# Patient Record
Sex: Male | Born: 1990 | Race: Black or African American | Hispanic: No | Marital: Single | Smoking: Current every day smoker
Health system: Southern US, Community
[De-identification: ages and names within clinical notes are randomized; demographics above are authoritative.]

## PROBLEM LIST (undated history)

## (undated) DIAGNOSIS — R55 Syncope and collapse: Secondary | ICD-10-CM

## (undated) DIAGNOSIS — K2970 Gastritis, unspecified, without bleeding: Secondary | ICD-10-CM

## (undated) DIAGNOSIS — R079 Chest pain, unspecified: Secondary | ICD-10-CM

## (undated) HISTORY — DX: Syncope and collapse: R55

## (undated) HISTORY — DX: Gastritis, unspecified, without bleeding: K29.70

## (undated) HISTORY — DX: Chest pain, unspecified: R07.9

---

## 2008-02-29 ENCOUNTER — Emergency Department (HOSPITAL_COMMUNITY): Admission: EM | Admit: 2008-02-29 | Discharge: 2008-02-29 | Payer: Self-pay | Admitting: Emergency Medicine

## 2009-02-10 ENCOUNTER — Emergency Department (HOSPITAL_COMMUNITY): Admission: EM | Admit: 2009-02-10 | Discharge: 2009-02-10 | Payer: Self-pay | Admitting: Emergency Medicine

## 2010-09-06 LAB — STREP A DNA PROBE: Group A Strep Probe: NEGATIVE

## 2010-10-31 ENCOUNTER — Inpatient Hospital Stay (INDEPENDENT_AMBULATORY_CARE_PROVIDER_SITE_OTHER)
Admission: RE | Admit: 2010-10-31 | Discharge: 2010-10-31 | Disposition: A | Discharge status: Home / Self Care | Payer: Self-pay | Source: Ambulatory Visit | Attending: Family Medicine | Admitting: Family Medicine

## 2010-10-31 DIAGNOSIS — K089 Disorder of teeth and supporting structures, unspecified: Secondary | ICD-10-CM

## 2011-03-03 LAB — POCT I-STAT, CHEM 8
Calcium, Ion: 1.16
Hemoglobin: 15.6
Sodium: 136
TCO2: 27

## 2011-12-05 ENCOUNTER — Emergency Department (HOSPITAL_COMMUNITY)
Admission: EM | Admit: 2011-12-05 | Discharge: 2011-12-05 | Disposition: A | Discharge status: Home / Self Care | Payer: No Typology Code available for payment source | Attending: Emergency Medicine | Admitting: Emergency Medicine

## 2011-12-05 ENCOUNTER — Encounter (HOSPITAL_COMMUNITY): Payer: Self-pay | Admitting: Physical Medicine and Rehabilitation

## 2011-12-05 DIAGNOSIS — F17200 Nicotine dependence, unspecified, uncomplicated: Secondary | ICD-10-CM | POA: Insufficient documentation

## 2011-12-05 DIAGNOSIS — S20219A Contusion of unspecified front wall of thorax, initial encounter: Secondary | ICD-10-CM

## 2011-12-05 DIAGNOSIS — M542 Cervicalgia: Secondary | ICD-10-CM | POA: Insufficient documentation

## 2011-12-05 DIAGNOSIS — R0789 Other chest pain: Secondary | ICD-10-CM | POA: Insufficient documentation

## 2011-12-05 DIAGNOSIS — S161XXA Strain of muscle, fascia and tendon at neck level, initial encounter: Secondary | ICD-10-CM

## 2011-12-05 DIAGNOSIS — M549 Dorsalgia, unspecified: Secondary | ICD-10-CM | POA: Insufficient documentation

## 2011-12-05 MED ORDER — OXYCODONE-ACETAMINOPHEN 5-325 MG PO TABS: 1.0000 | ORAL_TABLET | Freq: Four times a day (QID) | ORAL | Status: AC | PRN

## 2011-12-05 MED ORDER — IBUPROFEN 800 MG PO TABS
ORAL_TABLET | ORAL | Status: AC
  Filled 2011-12-05: qty 1

## 2011-12-05 MED ORDER — OXYCODONE-ACETAMINOPHEN 5-325 MG PO TABS
1.0000 | ORAL_TABLET | Freq: Once | ORAL | Status: AC
  Administered 2011-12-05: 1 via ORAL
  Filled 2011-12-05: qty 1

## 2011-12-05 MED ORDER — IBUPROFEN 400 MG PO TABS
800.0000 mg | ORAL_TABLET | Freq: Once | ORAL | Status: AC
  Administered 2011-12-05: 800 mg via ORAL

## 2011-12-05 MED ORDER — IBUPROFEN 800 MG PO TABS: 800.0000 mg | ORAL_TABLET | Freq: Three times a day (TID) | ORAL | Status: AC | PRN
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
## 2012-07-26 ENCOUNTER — Encounter (HOSPITAL_COMMUNITY): Payer: Self-pay | Admitting: *Deleted

## 2012-07-26 ENCOUNTER — Emergency Department (HOSPITAL_COMMUNITY)
Admission: EM | Admit: 2012-07-26 | Discharge: 2012-07-26 | Disposition: A | Discharge status: Home / Self Care | Payer: Self-pay | Attending: Emergency Medicine | Admitting: Emergency Medicine

## 2012-07-26 DIAGNOSIS — R079 Chest pain, unspecified: Secondary | ICD-10-CM

## 2012-07-26 DIAGNOSIS — F17200 Nicotine dependence, unspecified, uncomplicated: Secondary | ICD-10-CM | POA: Insufficient documentation

## 2012-07-26 DIAGNOSIS — R0789 Other chest pain: Secondary | ICD-10-CM | POA: Insufficient documentation

## 2012-07-26 LAB — CBC WITH DIFFERENTIAL/PLATELET
Basophils Absolute: 0 10*3/uL (ref 0.0–0.1)
Eosinophils Absolute: 0 10*3/uL (ref 0.0–0.7)
Eosinophils Relative: 0 % (ref 0–5)
Lymphocytes Relative: 21 % (ref 12–46)
Lymphs Abs: 1.8 10*3/uL (ref 0.7–4.0)
MCV: 82 fL (ref 78.0–100.0)
Neutrophils Relative %: 72 % (ref 43–77)
Platelets: 196 10*3/uL (ref 150–400)
RBC: 4.84 MIL/uL (ref 4.22–5.81)
RDW: 12.9 % (ref 11.5–15.5)
WBC: 8.8 10*3/uL (ref 4.0–10.5)

## 2012-07-26 LAB — COMPREHENSIVE METABOLIC PANEL
ALT: 15 U/L (ref 0–53)
AST: 20 U/L (ref 0–37)
Alkaline Phosphatase: 63 U/L (ref 39–117)
CO2: 28 mEq/L (ref 19–32)
Calcium: 9.6 mg/dL (ref 8.4–10.5)
Glucose, Bld: 93 mg/dL (ref 70–99)
Potassium: 3.7 mEq/L (ref 3.5–5.1)
Sodium: 139 mEq/L (ref 135–145)
Total Protein: 7.1 g/dL (ref 6.0–8.3)

## 2012-07-26 NOTE — ED Notes (Signed)
The pt has had lt upper chest pain for 2-3 years.  His pain has been worse for the past 2-3 months.  No distress.  The pain is worse with movement

## 2012-07-26 NOTE — ED Provider Notes (Signed)
History    This chart was scribed for Lyanne Co, MD by Gerlean Ren, ED Scribe. This patient was seen in room TR09C/TR09C and the patient's care was started at 10:11 PM    CSN: [id number]  Arrival date & time 07/26/12  2042   First MD Initiated Contact with Patient 07/26/12 2158      Chief Complaint  Patient presents with  . Chest Pain     The history is provided by the patient. No language interpreter was used.  Bradley Huff is a 22 y.o. male who presents to the Emergency Department complaining of 2-3 months of constant sharp sternal chest pain worsened by deep breathing for which pt has not received any medical attention.  Pt denies dyspnea, abdominal pain, nausea, emesis, rash. No PCP.  History reviewed. No pertinent past medical history.  History reviewed. No pertinent past surgical history.  No family history on file.  History  Substance Use Topics  . Smoking status: Current Every Day Smoker -- 1.00 packs/day    Types: Cigarettes  . Smokeless tobacco: Not on file  . Alcohol Use: Yes      Review of Systems A complete 10 system review of systems was obtained and all systems are negative except as noted in the HPI and PMH.   Allergies  Review of patient's allergies indicates no known allergies.  Home Medications  No current outpatient prescriptions on file.  BP 119/70  Pulse 58  Temp(Src) 98.2 F (36.8 C) (Oral)  Resp 16  SpO2 99%  Physical Exam  Nursing note and vitals reviewed. Constitutional: He is oriented to person, place, and time. He appears well-developed and well-nourished.  HENT:  Head: Normocephalic.  Eyes: EOM are normal.  Neck: Normal range of motion.  Cardiovascular: Normal rate, regular rhythm and normal heart sounds.   Pulmonary/Chest: Effort normal and breath sounds normal. No respiratory distress. He has no wheezes. He has no rales.  Abdominal: He exhibits no distension.  Musculoskeletal: Normal range of motion.  Neurological: He  is alert and oriented to person, place, and time.  Psychiatric: He has a normal mood and affect.    ED Course  Procedures (including critical care time) DIAGNOSTIC STUDIES: Oxygen Saturation is 99% on room air, normal by my interpretation.    COORDINATION OF CARE: 10:13 PM- Informed pt that blood work and EKG are both normal and that chance of emergent cardiovascular problem are extremely low.  Advised follow-up with PCP.  Results for orders placed during the hospital encounter of 07/26/12  CBC WITH DIFFERENTIAL      Result Value Range   WBC 8.8  4.0 - 10.5 K/uL   RBC 4.84  4.22 - 5.81 MIL/uL   Hemoglobin 14.7  13.0 - 17.0 g/dL   HCT 78.2  95.6 - 21.3 %   MCV 82.0  78.0 - 100.0 fL   MCH 30.4  26.0 - 34.0 pg   MCHC 37.0 (*) 30.0 - 36.0 g/dL   RDW 08.6  57.8 - 46.9 %   Platelets 196  150 - 400 K/uL   Neutrophils Relative 72  43 - 77 %   Neutro Abs 6.4  1.7 - 7.7 K/uL   Lymphocytes Relative 21  12 - 46 %   Lymphs Abs 1.8  0.7 - 4.0 K/uL   Monocytes Relative 6  3 - 12 %   Monocytes Absolute 0.6  0.1 - 1.0 K/uL   Eosinophils Relative 0  0 - 5 %  Eosinophils Absolute 0.0  0.0 - 0.7 K/uL   Basophils Relative 0  0 - 1 %   Basophils Absolute 0.0  0.0 - 0.1 K/uL  COMPREHENSIVE METABOLIC PANEL      Result Value Range   Sodium 139  135 - 145 mEq/L   Potassium 3.7  3.5 - 5.1 mEq/L   Chloride 101  96 - 112 mEq/L   CO2 28  19 - 32 mEq/L   Glucose, Bld 93  70 - 99 mg/dL   BUN 9  6 - 23 mg/dL   Creatinine, Ser 1.61  0.50 - 1.35 mg/dL   Calcium 9.6  8.4 - 09.6 mg/dL   Total Protein 7.1  6.0 - 8.3 g/dL   Albumin 4.0  3.5 - 5.2 g/dL   AST 20  0 - 37 U/L   ALT 15  0 - 53 U/L   Alkaline Phosphatase 63  39 - 117 U/L   Total Bilirubin 0.3  0.3 - 1.2 mg/dL   GFR calc non Af Amer >90  >90 mL/min   GFR calc Af Amer >90  >90 mL/min  TROPONIN I      Result Value Range   Troponin I <0.30  <0.30 ng/mL    No results found.    Date: 07/26/2012  Rate: 51  Rhythm: Sinus bradycardia  QRS  Axis: normal  Intervals: normal  ST/T Wave abnormalities: normal  Conduction Disutrbances: none  Narrative Interpretation:   Old EKG Reviewed: No prior EKG available      1. Chest pain       MDM  The patient's chest pain is noncardiac.  Doubt pulmonary embolism.  The patient is PERC negative.    I personally performed the services described in this documentation, which was scribed in my presence. The recorded information has been reviewed and is accurate.           Lyanne Co, MD 07/26/12 2229

## 2012-09-08 ENCOUNTER — Encounter (HOSPITAL_COMMUNITY): Payer: Self-pay | Admitting: *Deleted

## 2012-09-08 ENCOUNTER — Emergency Department (HOSPITAL_COMMUNITY)
Admission: EM | Admit: 2012-09-08 | Discharge: 2012-09-08 | Disposition: A | Discharge status: Home / Self Care | Payer: Self-pay | Attending: Emergency Medicine | Admitting: Emergency Medicine

## 2012-09-08 DIAGNOSIS — R509 Fever, unspecified: Secondary | ICD-10-CM | POA: Insufficient documentation

## 2012-09-08 DIAGNOSIS — F17200 Nicotine dependence, unspecified, uncomplicated: Secondary | ICD-10-CM | POA: Insufficient documentation

## 2012-09-08 DIAGNOSIS — K2970 Gastritis, unspecified, without bleeding: Secondary | ICD-10-CM

## 2012-09-08 DIAGNOSIS — A088 Other specified intestinal infections: Secondary | ICD-10-CM | POA: Insufficient documentation

## 2012-09-08 DIAGNOSIS — R112 Nausea with vomiting, unspecified: Secondary | ICD-10-CM | POA: Insufficient documentation

## 2012-09-08 DIAGNOSIS — R630 Anorexia: Secondary | ICD-10-CM | POA: Insufficient documentation

## 2012-09-08 LAB — COMPREHENSIVE METABOLIC PANEL
Alkaline Phosphatase: 58 U/L (ref 39–117)
BUN: 10 mg/dL (ref 6–23)
Chloride: 99 mEq/L (ref 96–112)
Creatinine, Ser: 1.23 mg/dL (ref 0.50–1.35)
GFR calc Af Amer: >90 mL/min (ref >90)
GFR calc non Af Amer: 81 mL/min — ABNORMAL LOW (ref >90)
Glucose, Bld: 79 mg/dL (ref 70–99)
Potassium: 3.6 mEq/L (ref 3.5–5.1)
Total Bilirubin: 0.5 mg/dL (ref 0.3–1.2)

## 2012-09-08 LAB — URINALYSIS, ROUTINE W REFLEX MICROSCOPIC
Bilirubin Urine: NEGATIVE
Leukocytes, UA: NEGATIVE
Nitrite: NEGATIVE
Specific Gravity, Urine: 1.02 (ref 1.005–1.030)
Urobilinogen, UA: 0.2 mg/dL (ref 0.0–1.0)

## 2012-09-08 LAB — CBC WITH DIFFERENTIAL/PLATELET
Basophils Absolute: 0 10*3/uL (ref 0.0–0.1)
Basophils Relative: 0 % (ref 0–1)
Lymphocytes Relative: 33 % (ref 12–46)
MCHC: 36.7 g/dL — ABNORMAL HIGH (ref 30.0–36.0)
Monocytes Absolute: 0.6 10*3/uL (ref 0.1–1.0)
Neutro Abs: 2.5 10*3/uL (ref 1.7–7.7)
Neutrophils Relative %: 53 % (ref 43–77)
Platelets: 200 10*3/uL (ref 150–400)
RDW: 12.6 % (ref 11.5–15.5)
WBC: 4.7 10*3/uL (ref 4.0–10.5)

## 2012-09-08 LAB — LIPASE, BLOOD: Lipase: 20 U/L (ref 11–59)

## 2012-09-08 MED ORDER — ONDANSETRON HCL 4 MG PO TABS: 4.0000 mg | ORAL_TABLET | Freq: Three times a day (TID) | ORAL | Status: DC | PRN

## 2012-09-08 NOTE — ED Notes (Addendum)
Pt c/o abd pain above umbilicus x 2days. Pt describes pain as sharp with no radiation, rates pain 6/10. Pt denies any nause/v/diarrhea at this time. Pt states he had n/v 2 days ago and a fever. Pt states he has not eaten in 2 days due to loss of appetite.

## 2012-09-08 NOTE — ED Provider Notes (Signed)
History     CSN: [id number]  Arrival date & time 09/08/12  1157   First MD Initiated Contact with Patient 09/08/12 1241      Chief Complaint  Patient presents with  . Abdominal Pain    (Consider location/radiation/quality/duration/timing/severity/associated sxs/prior treatment) Patient is a 22 y.o. male presenting with abdominal pain. The history is provided by the patient and medical records. No language interpreter was used.  Abdominal Pain Pain location:  Epigastric Pain quality: aching and sharp   Pain radiates to:  Does not radiate Pain severity:  Mild Onset quality:  Gradual Duration:  3 days Timing:  Intermittent Progression:  Resolved Context: recent illness and retching   Context: not suspicious food intake   Worsened by:  Vomiting Ineffective treatments:  None tried Associated symptoms: anorexia, fever (subjective, unmeasured), nausea ( now resolved) and vomiting (X1)   Associated symptoms: no chest pain, no chills, no constipation, no cough, no diarrhea, no dysuria, no fatigue, no hematemesis, no hematochezia, no hematuria, no shortness of breath and no sore throat     Bradley Huff is a 22 y.o. male  with no known medical Hx presents to the Emergency Department complaining of gradual, waxing and waning epigastric abdominal pain onset 3 days ago.  Associated symptoms include nausea and vomiting x1, unmeasured fever, sweats and chills all now resolved, persistent anorexia.  Pt has been drinking water without difficulty for the past 24 hours.  Pt describes his pain and cramping, pain in the epigastrium, rated at a 3/10 and nonradiating. Patient states nothing makes it better and nothing makes it worse.  Patient states he came to be evaluated because his job recommended that he get checked and get a note. Pt denies headache, chest pain, shortness of breath, diarrhea, weakness, dizziness, syncope, dysuria, hematuria.  Pt smokes cigarettes and marijuana.    History reviewed.  No pertinent past medical history.  History reviewed. No pertinent past surgical history.  No family history on file.  History  Substance Use Topics  . Smoking status: Current Every Day Smoker -- 0.25 packs/day    Types: Cigarettes  . Smokeless tobacco: Not on file  . Alcohol Use: 1.2 oz/week    2 Cans of beer per week      Review of Systems  Constitutional: Positive for fever (subjective, unmeasured). Negative for chills, diaphoresis, appetite change, fatigue and unexpected weight change.  HENT: Negative for sore throat, mouth sores, trouble swallowing, neck pain and neck stiffness.   Respiratory: Negative for cough, chest tightness, shortness of breath, wheezing and stridor.   Cardiovascular: Negative for chest pain and palpitations.  Gastrointestinal: Positive for nausea ( now resolved), vomiting (X1), abdominal pain and anorexia. Negative for diarrhea, constipation, blood in stool, hematochezia, abdominal distention, rectal pain and hematemesis.  Genitourinary: Negative for dysuria, urgency, frequency, hematuria, flank pain and difficulty urinating.  Musculoskeletal: Negative for back pain.  Skin: Negative for rash.  Neurological: Negative for weakness.  Hematological: Negative for adenopathy.  Psychiatric/Behavioral: Negative for confusion.  All other systems reviewed and are negative.    Allergies  Review of patient's allergies indicates no known allergies.  Home Medications   Current Outpatient Rx  Name  Route  Sig  Dispense  Refill  . ondansetron (ZOFRAN) 4 MG tablet   Oral   Take 1 tablet (4 mg total) by mouth every 8 (eight) hours as needed for nausea.   10 tablet   0     BP 109/71  Pulse 72  Temp(Src)  97.7 F (36.5 C)  Resp 16  SpO2 99%  Physical Exam  Nursing note and vitals reviewed. Constitutional: He is oriented to person, place, and time. He appears well-developed and well-nourished. No distress.  HENT:  Head: Normocephalic and atraumatic.   Right Ear: Tympanic membrane, external ear and ear canal normal.  Left Ear: Tympanic membrane, external ear and ear canal normal.  Nose: Nose normal. No mucosal edema or rhinorrhea.  Mouth/Throat: Uvula is midline, oropharynx is clear and moist and mucous membranes are normal. Mucous membranes are not dry and not cyanotic. No oropharyngeal exudate, posterior oropharyngeal edema, posterior oropharyngeal erythema or tonsillar abscesses.  Eyes: Conjunctivae and EOM are normal. Pupils are equal, round, and reactive to light. No scleral icterus.  Neck: Normal range of motion. Neck supple.  Cardiovascular: Normal rate, regular rhythm, normal heart sounds and intact distal pulses.  Exam reveals no gallop and no friction rub.   No murmur heard. Pulmonary/Chest: Effort normal and breath sounds normal. No respiratory distress. He has no wheezes. He has no rales. He exhibits no tenderness.  Abdominal: Soft. Normal appearance and bowel sounds are normal. He exhibits no distension and no mass. There is no tenderness. There is no rigidity, no rebound, no guarding, no CVA tenderness, no tenderness at McBurney's point and negative Murphy's sign.  Musculoskeletal: Normal range of motion. He exhibits no edema and no tenderness.  Lymphadenopathy:    He has no cervical adenopathy.  Neurological: He is alert and oriented to person, place, and time. He exhibits normal muscle tone. Coordination normal.  Speech is clear and goal oriented Moves extremities without ataxia  Skin: Skin is warm and dry. No rash noted. He is not diaphoretic. No erythema.  Psychiatric: He has a normal mood and affect.    ED Course  Procedures (including critical care time)  Labs Reviewed  COMPREHENSIVE METABOLIC PANEL - Abnormal; Notable for the following:    GFR calc non Af Amer 81 (*)    All other components within normal limits  CBC WITH DIFFERENTIAL - Abnormal; Notable for the following:    MCHC 36.7 (*)    All other components  within normal limits  LIPASE, BLOOD  URINALYSIS, ROUTINE W REFLEX MICROSCOPIC   No results found.   1. Viral gastritis       MDM  Bradley Huff presents with history and physical consistent with gastritis.  Patient with symptoms consistent with viral gastritis.  Vitals are stable, no fever.  Patient is nontoxic, nonseptic appearing, in no apparent distress.  Patient does not meet the SIRS or Sepsis criteria.  No signs of dehydration, tolerating PO fluids > 6 oz.  Lungs are clear.  No focal abdominal pain, no peritoneal signs, no concern for appendicitis, cholecystitis, pancreatitis, ruptured viscus, UTI, kidney stone or any other significant abdominal etiology.  Supportive therapy indicated with return if symptoms worsen.  Patient counseled.  I have also discussed reasons to return immediately to the ER.  Patient expresses understanding and agrees with plan.  1. Medications: zofran, usual home medications 2. Treatment: rest, drink plenty of fluids,  3. Follow Up: Please followup with your primary doctor for discussion of your diagnoses and further evaluation after today's visit; if you do not have a primary care doctor use the resource guide provided to find one;           Dierdre Forth, PA-C 09/08/12 1439

## 2012-09-08 NOTE — ED Notes (Signed)
Pt c/o peri-umbilical pain x 3 days.  On the first day, emesis x 1 and fever 101.1.  Since then, no fever or emesis, but pain continues and pt states unable to tolerate solid foods d/t bitter after-taste.  Denies bowel or bladder s/s.

## 2012-09-08 NOTE — ED Notes (Signed)
Discharge instructions reviewed. Pt verbalized understanding.  

## 2012-09-09 NOTE — ED Provider Notes (Signed)
History/physical exam/procedure(s) were performed by non-physician practitioner and as supervising physician I was immediately available for consultation/collaboration. I have reviewed all notes and am in agreement with care and plan.   Hilario Quarry, MD 09/09/12 [phone]

## 2012-10-15 ENCOUNTER — Emergency Department (HOSPITAL_COMMUNITY)
Admission: EM | Admit: 2012-10-15 | Discharge: 2012-10-15 | Disposition: A | Discharge status: Home / Self Care | Payer: Self-pay | Attending: Emergency Medicine | Admitting: Emergency Medicine

## 2012-10-15 ENCOUNTER — Emergency Department (HOSPITAL_COMMUNITY): Payer: Self-pay

## 2012-10-15 ENCOUNTER — Encounter (HOSPITAL_COMMUNITY): Payer: Self-pay | Admitting: Cardiology

## 2012-10-15 DIAGNOSIS — F17200 Nicotine dependence, unspecified, uncomplicated: Secondary | ICD-10-CM | POA: Insufficient documentation

## 2012-10-15 DIAGNOSIS — R0789 Other chest pain: Secondary | ICD-10-CM | POA: Insufficient documentation

## 2012-10-15 MED ORDER — IBUPROFEN 600 MG PO TABS: 600.0000 mg | ORAL_TABLET | Freq: Four times a day (QID) | ORAL | Status: DC | PRN

## 2012-10-15 NOTE — ED Provider Notes (Signed)
History     CSN: [id number]  Arrival date & time 10/15/12  1059   First MD Initiated Contact with Patient 10/15/12 1405      Chief Complaint  Patient presents with  . Chest Pain    (Consider location/radiation/quality/duration/timing/severity/associated sxs/prior treatment) HPI  22 year old male presents complaining of chest pain. Patient reports gradual onset of persistent left upper chest pain adjacent to his left axillary region ongoing for over 2 months. Pain is described as a dull and achy sensation, sometimes worsen with movement but not always. He tries to take deep breaths to see if it relief the pain, but it doesn't.  He was seen for the same complaint over a month ago, states his workup was unremarkable, however the pain has persisted. Patient is right-hand dominant. He works at [employer]. He is a smoker and smoked about a pack every 3 days. He denies any fever, chills, headache, cough, hemoptysis, shortness of breath, dyspnea on exertion, dizziness, lightheadedness, shoulder pain, rash, numbness, or weakness. No specific treatment tried. No significant family history of premature cardiac disease , hypertension or diabetes. No history of exertional syncope. No leg swelling or calf pain.  History reviewed. No pertinent past medical history.  History reviewed. No pertinent past surgical history.  History reviewed. No pertinent family history.  History  Substance Use Topics  . Smoking status: Current Every Day Smoker -- 0.25 packs/day    Types: Cigarettes  . Smokeless tobacco: Not on file  . Alcohol Use: 1.2 oz/week    2 Cans of beer per week      Review of Systems  Constitutional:       A complete 10 system review of systems was obtained and all systems are negative except as noted in the HPI and PMH.    Allergies  Review of patient's allergies indicates no known allergies.  Home Medications   Current Outpatient Rx  Name  Route  Sig  Dispense  Refill  .  ondansetron (ZOFRAN) 4 MG tablet   Oral   Take 1 tablet (4 mg total) by mouth every 8 (eight) hours as needed for nausea.   10 tablet   0     BP 109/55  Pulse 45  Temp(Src) 98.8 F (37.1 C) (Oral)  Resp 18  SpO2 98%  Physical Exam  Nursing note and vitals reviewed. Constitutional: He is oriented to person, place, and time. He appears well-developed and well-nourished. No distress.  Awake, alert, nontoxic appearance  HENT:  Head: Atraumatic.  Mouth/Throat: Oropharynx is clear and moist.  Eyes: Conjunctivae are normal. Right eye exhibits no discharge. Left eye exhibits no discharge.  Neck: Normal range of motion. Neck supple.  Cardiovascular: Normal rate and regular rhythm.  Exam reveals no gallop and no friction rub.   No murmur heard. Pulmonary/Chest: Effort normal and breath sounds normal. No respiratory distress. He exhibits no tenderness (No chest wall tenderness, emphysema, crepitus, or deformity noted. No overlying skin changes.).  Abdominal: Soft. There is no tenderness. There is no rebound.  Musculoskeletal: He exhibits no edema and no tenderness.  ROM appears intact, no obvious focal weakness  Normal left and right shoulder range of motion, normal grip strength  Neurological: He is alert and oriented to person, place, and time.  Skin: Skin is warm and dry. No rash noted.  Psychiatric: He has a normal mood and affect.    ED Course  Procedures (including critical care time)   Date: 10/15/2012  Rate: 45  Rhythm: sinus bradycardia  QRS Axis: normal  Intervals: normal  ST/T Wave abnormalities: normal  Conduction Disutrbances:none  Narrative Interpretation: unchanged from prior  Old EKG Reviewed: unchanged    2:19 PM Patient reports chest pain for the past 2 months. Pain is atypical for cardiac disease. Pain is not suggestive of heartburn. No fever, or skin changes course and for infection. No significant family history of cardiac disease. Symptom does not  suggest PE. Will obtain chest x-ray and EKG.  3:19 PM No ECG changes, CXR unremarkable.  Reassurance given.  Recommend pt to find a PCP for further care.  Resources given, ibuprofen for pain, return precaution discussed.  Pt agrees with plan.    Labs Reviewed - No data to display Dg Chest 2 View  10/15/2012   *RADIOLOGY REPORT*  Clinical Data: Upper chest pain  CHEST - 2 VIEW  Comparison:  None.  Findings:  The heart size and mediastinal contours are within normal limits.  Both lungs are clear.  The visualized skeletal structures are unremarkable.  IMPRESSION: No active cardiopulmonary disease.   Original Report Authenticated By: Judie Petit. Shick, M.D.     1. Chest pain, atypical       MDM  BP 109/55  Pulse 45  Temp(Src) 98.8 F (37.1 C) (Oral)  Resp 18  SpO2 98%  I have reviewed nursing notes and vital signs. I personally reviewed the imaging tests through PACS system  I reviewed available ER/hospitalization records thought the EMR         Fayrene Helper, PA-C 10/15/12 1520

## 2012-10-15 NOTE — ED Notes (Signed)
Pt reports he was seen here for the same about a month ago. Reports that he had a clean work-up. State that he is still having pain with deep breaths and with movement. Denies any SOB or n/v with the pain.

## 2012-10-25 NOTE — ED Provider Notes (Signed)
Medical screening examination/treatment/procedure(s) were performed by non-physician practitioner and as supervising physician I was immediately available for consultation/collaboration.  Hurman Horn, MD 10/25/12 [phone]

## 2012-11-04 ENCOUNTER — Encounter: Payer: Self-pay | Admitting: *Deleted

## 2012-11-04 ENCOUNTER — Encounter: Payer: Self-pay | Admitting: Cardiovascular Disease

## 2012-11-04 DIAGNOSIS — R079 Chest pain, unspecified: Secondary | ICD-10-CM | POA: Insufficient documentation

## 2012-11-04 DIAGNOSIS — K2970 Gastritis, unspecified, without bleeding: Secondary | ICD-10-CM | POA: Insufficient documentation

## 2012-11-04 DIAGNOSIS — R55 Syncope and collapse: Secondary | ICD-10-CM | POA: Insufficient documentation

## 2012-11-05 ENCOUNTER — Ambulatory Visit: Payer: Self-pay | Admitting: Cardiovascular Disease

## 2013-06-30 IMAGING — CR DG CHEST 2V
2 series · 2 of 2 positions shown · non-contrast
Comparison: None.

CLINICAL DATA: Upper chest pain

CHEST - 2 VIEW

[w chest pa]
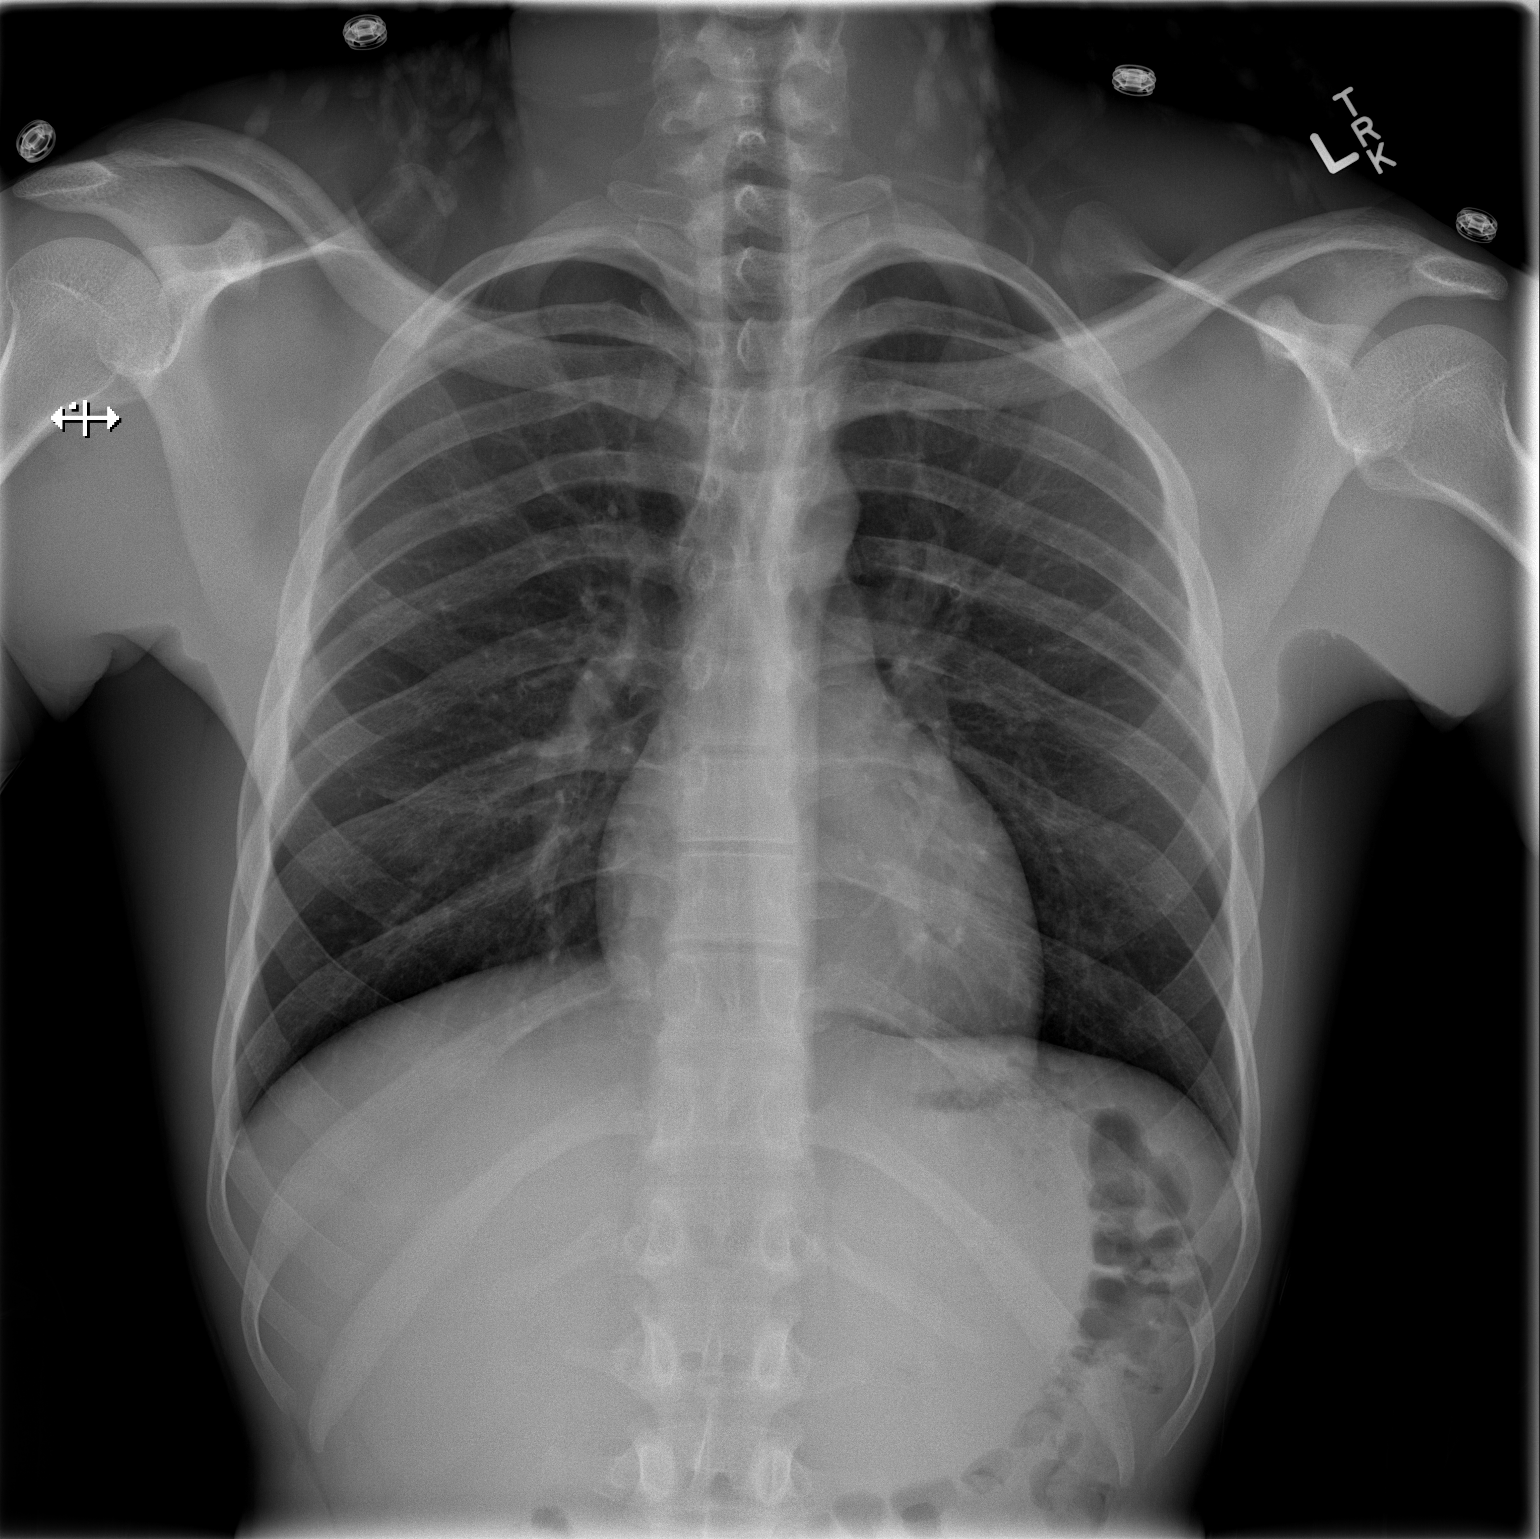

[w chest lat]
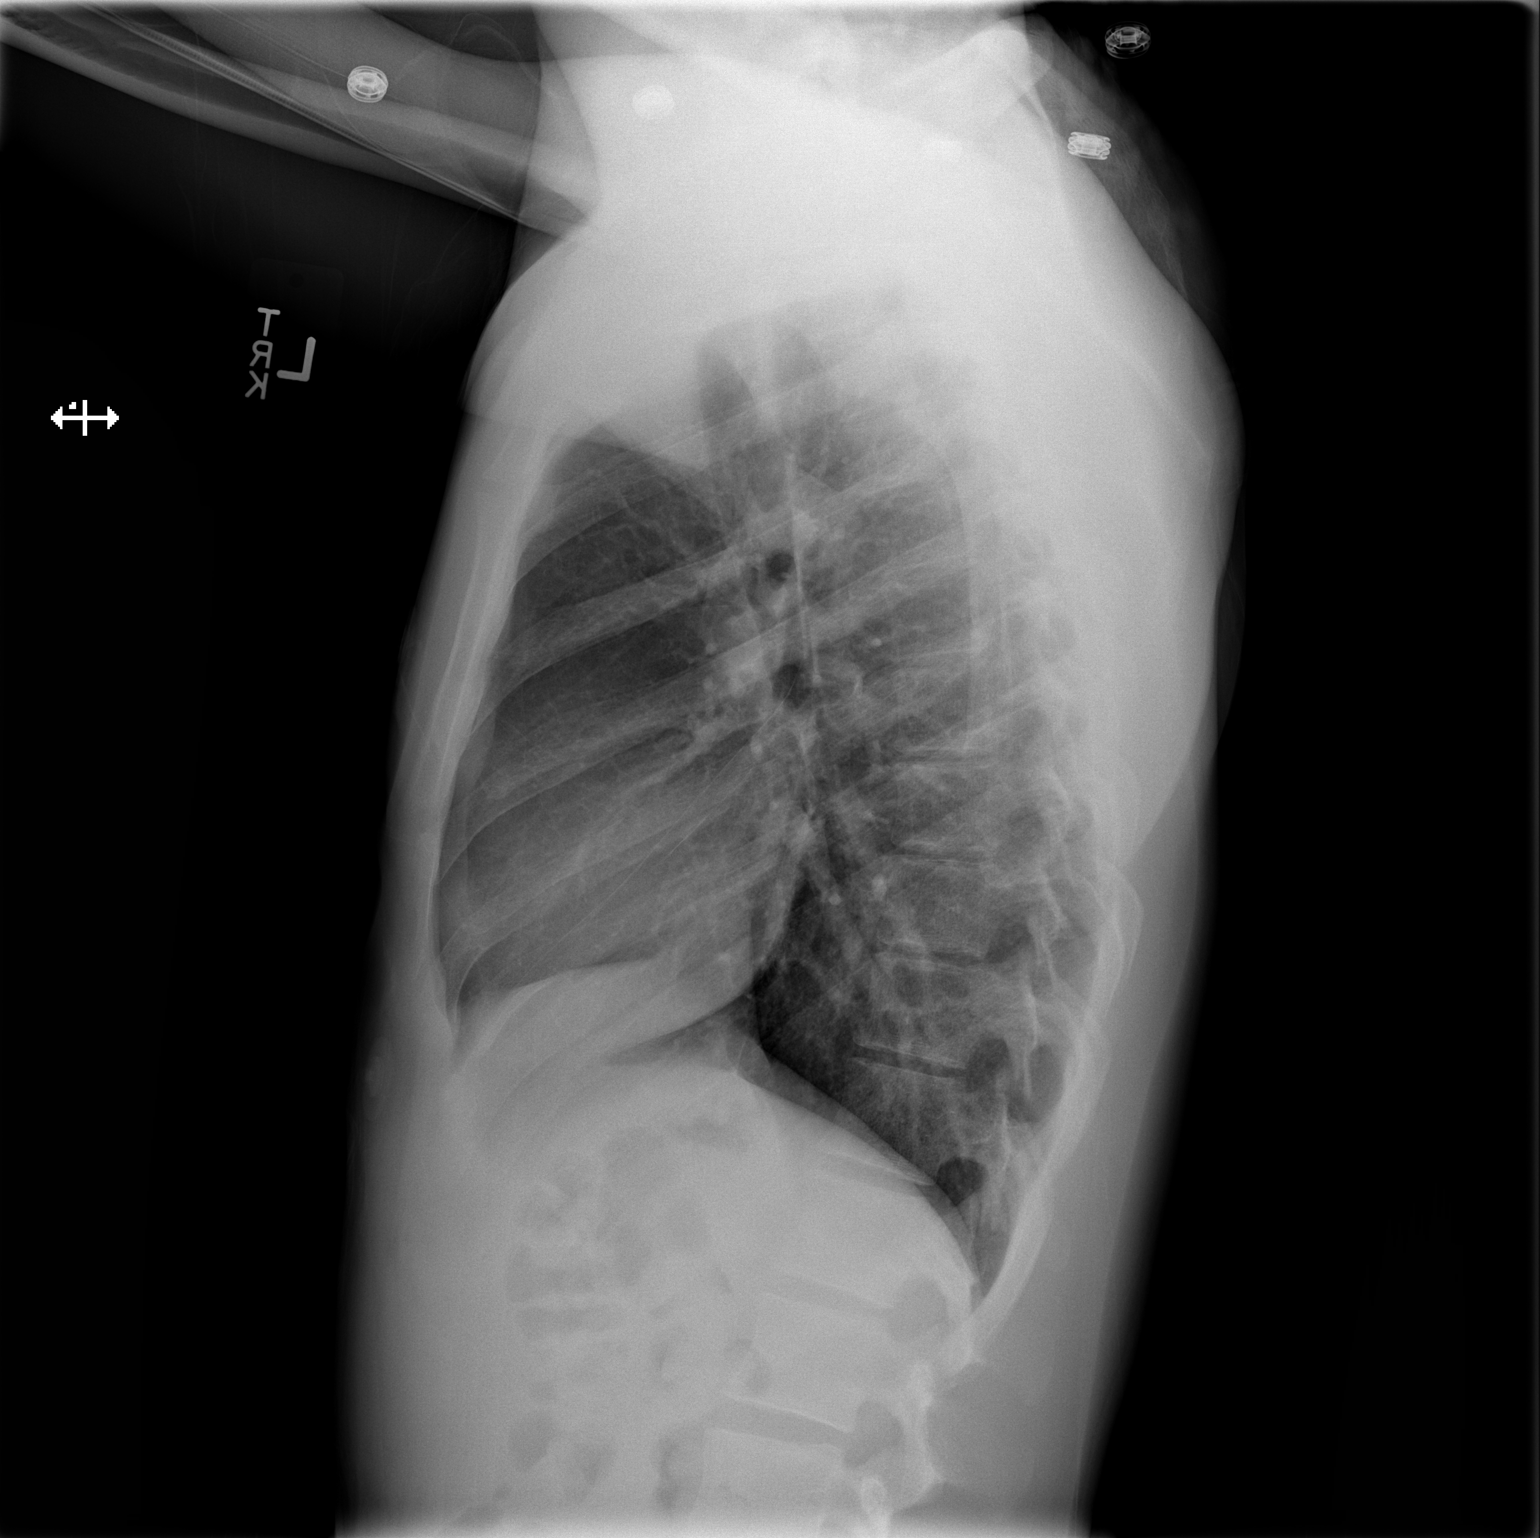

[2 of 2 positions shown; findings below may reference images not displayed]

FINDINGS: The heart size and mediastinal contours are within
normal limits.  Both lungs are clear.  The visualized skeletal
structures are unremarkable.
IMPRESSION: No active cardiopulmonary disease.

## 2013-07-01 ENCOUNTER — Emergency Department (HOSPITAL_COMMUNITY)
Admission: EM | Admit: 2013-07-01 | Discharge: 2013-07-01 | Disposition: A | Discharge status: Home / Self Care | Payer: No Typology Code available for payment source | Attending: Emergency Medicine | Admitting: Emergency Medicine

## 2013-07-01 ENCOUNTER — Encounter (HOSPITAL_COMMUNITY): Payer: Self-pay | Admitting: Emergency Medicine

## 2013-07-01 ENCOUNTER — Emergency Department (HOSPITAL_COMMUNITY): Payer: No Typology Code available for payment source

## 2013-07-01 DIAGNOSIS — R011 Cardiac murmur, unspecified: Secondary | ICD-10-CM | POA: Insufficient documentation

## 2013-07-01 DIAGNOSIS — Z87828 Personal history of other (healed) physical injury and trauma: Secondary | ICD-10-CM | POA: Insufficient documentation

## 2013-07-01 DIAGNOSIS — I498 Other specified cardiac arrhythmias: Secondary | ICD-10-CM | POA: Insufficient documentation

## 2013-07-01 DIAGNOSIS — R001 Bradycardia, unspecified: Secondary | ICD-10-CM

## 2013-07-01 DIAGNOSIS — R0789 Other chest pain: Secondary | ICD-10-CM | POA: Insufficient documentation

## 2013-07-01 DIAGNOSIS — R079 Chest pain, unspecified: Secondary | ICD-10-CM

## 2013-07-01 DIAGNOSIS — F17200 Nicotine dependence, unspecified, uncomplicated: Secondary | ICD-10-CM | POA: Insufficient documentation

## 2013-07-01 DIAGNOSIS — Z8719 Personal history of other diseases of the digestive system: Secondary | ICD-10-CM | POA: Insufficient documentation

## 2013-07-01 LAB — POCT I-STAT TROPONIN I: Troponin i, poc: 0 ng/mL (ref 0.00–0.08)

## 2013-07-01 LAB — CBC
HEMATOCRIT: 45.7 % (ref 39.0–52.0)
HEMOGLOBIN: 16.4 g/dL (ref 13.0–17.0)
MCH: 30 pg (ref 26.0–34.0)
MCHC: 35.9 g/dL (ref 30.0–36.0)
MCV: 83.7 fL (ref 78.0–100.0)
Platelets: 191 10*3/uL (ref 150–400)
RBC: 5.46 MIL/uL (ref 4.22–5.81)
RDW: 12.9 % (ref 11.5–15.5)
WBC: 6.4 10*3/uL (ref 4.0–10.5)

## 2013-07-01 LAB — COMPREHENSIVE METABOLIC PANEL
ALK PHOS: 70 U/L (ref 39–117)
ALT: 18 U/L (ref 0–53)
AST: 25 U/L (ref 0–37)
Albumin: 4.4 g/dL (ref 3.5–5.2)
BILIRUBIN TOTAL: 0.5 mg/dL (ref 0.3–1.2)
BUN: 9 mg/dL (ref 6–23)
CHLORIDE: 97 meq/L (ref 96–112)
CO2: 26 meq/L (ref 19–32)
Calcium: 9.9 mg/dL (ref 8.4–10.5)
Creatinine, Ser: 0.91 mg/dL (ref 0.50–1.35)
Glucose, Bld: 92 mg/dL (ref 70–99)
POTASSIUM: 4.1 meq/L (ref 3.7–5.3)
Sodium: 136 mEq/L — ABNORMAL LOW (ref 137–147)
Total Protein: 8.1 g/dL (ref 6.0–8.3)

## 2013-07-01 NOTE — Discharge Instructions (Signed)
Call for a follow up appointment with a Family or Primary Care Provider.  [facility] appointment [facility] for further evaluation of your chest pain. Return if Symptoms worsen.   Take medication as prescribed.

## 2013-07-01 NOTE — ED Notes (Signed)
Cp for a year  Bradley FlesherWent away now back 3 months ago  No n/v/sob
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
## 2013-07-04 NOTE — ED Provider Notes (Signed)
Shared service with midlevel provider. I have personally seen and examined the patient, providing direct face to face care, presenting with the chief complaint of chest pain. Physical exam findings include normal cardiac and lung exam. CP x few weeks now. No premature CAD in the family. Basic workup fine, HEART score is <2.  Plan will be to d/c post initial workup. I have reviewed the nursing documentation on past medical history, family history, and social history.   Derwood KaplanAnkit Lawton Dollinger, MD 07/04/13 [phone]

## 2013-07-19 ENCOUNTER — Ambulatory Visit: Payer: Self-pay | Admitting: Cardiovascular Disease

## 2013-07-27 ENCOUNTER — Ambulatory Visit: Payer: Self-pay | Admitting: Cardiovascular Disease

## 2013-08-09 ENCOUNTER — Ambulatory Visit: Payer: Self-pay | Admitting: Cardiovascular Disease

## 2013-08-16 ENCOUNTER — Encounter: Payer: Self-pay | Admitting: Cardiovascular Disease

## 2016-07-16 ENCOUNTER — Encounter (HOSPITAL_COMMUNITY): Payer: Self-pay | Admitting: Emergency Medicine

## 2016-07-16 ENCOUNTER — Emergency Department (HOSPITAL_COMMUNITY)
Admission: EM | Admit: 2016-07-16 | Discharge: 2016-07-16 | Disposition: A | Discharge status: Home / Self Care | Payer: Self-pay | Attending: Emergency Medicine | Admitting: Emergency Medicine

## 2016-07-16 DIAGNOSIS — R52 Pain, unspecified: Secondary | ICD-10-CM | POA: Insufficient documentation

## 2016-07-16 DIAGNOSIS — F17210 Nicotine dependence, cigarettes, uncomplicated: Secondary | ICD-10-CM | POA: Insufficient documentation

## 2016-07-16 DIAGNOSIS — R6889 Other general symptoms and signs: Secondary | ICD-10-CM

## 2016-07-16 DIAGNOSIS — R61 Generalized hyperhidrosis: Secondary | ICD-10-CM | POA: Insufficient documentation

## 2016-07-16 DIAGNOSIS — R110 Nausea: Secondary | ICD-10-CM | POA: Insufficient documentation

## 2016-07-16 MED ORDER — OSELTAMIVIR PHOSPHATE 75 MG PO CAPS: 75.0000 mg | ORAL_CAPSULE | Freq: Two times a day (BID) | ORAL | 0 refills | Status: AC

## 2016-07-16 MED ORDER — IBUPROFEN 600 MG PO TABS: 600.0000 mg | ORAL_TABLET | Freq: Four times a day (QID) | ORAL | 0 refills | Status: AC | PRN

## 2016-07-16 MED ORDER — BENZONATATE 100 MG PO CAPS: 200.0000 mg | ORAL_CAPSULE | Freq: Two times a day (BID) | ORAL | 0 refills | Status: AC | PRN

## 2016-07-16 MED ORDER — ONDANSETRON HCL 4 MG PO TABS: 4.0000 mg | ORAL_TABLET | Freq: Three times a day (TID) | ORAL | 0 refills | Status: AC | PRN

## 2016-07-16 NOTE — ED Triage Notes (Signed)
Pt from home with c/o body aches, hot/cold chills, cough, and nausea starting yesterday. Pt has not taken a temperature at home.  NAD, A&O.
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
## 2022-06-22 ENCOUNTER — Ambulatory Visit (HOSPITAL_COMMUNITY)
Admission: EM | Admit: 2022-06-22 | Discharge: 2022-06-22 | Disposition: A | Discharge status: Home / Self Care | Payer: Self-pay | Attending: Family Medicine | Admitting: Family Medicine

## 2022-06-22 ENCOUNTER — Encounter (HOSPITAL_COMMUNITY): Payer: Self-pay | Admitting: *Deleted

## 2022-06-22 DIAGNOSIS — K047 Periapical abscess without sinus: Secondary | ICD-10-CM

## 2022-06-22 DIAGNOSIS — K0381 Cracked tooth: Secondary | ICD-10-CM

## 2022-06-22 MED ORDER — HYDROCODONE-ACETAMINOPHEN 5-325 MG PO TABS: 1.0000 | ORAL_TABLET | Freq: Four times a day (QID) | ORAL | 0 refills | Status: AC | PRN

## 2022-06-22 MED ORDER — KETOROLAC TROMETHAMINE 30 MG/ML IJ SOLN
30.0000 mg | Freq: Once | INTRAMUSCULAR | Status: AC
  Administered 2022-06-22: 30 mg via INTRAMUSCULAR

## 2022-06-22 MED ORDER — AMOXICILLIN-POT CLAVULANATE 875-125 MG PO TABS: 1.0000 | ORAL_TABLET | Freq: Two times a day (BID) | ORAL | 0 refills | Status: AC

## 2022-06-22 MED ORDER — KETOROLAC TROMETHAMINE 30 MG/ML IJ SOLN
INTRAMUSCULAR | Status: AC
  Filled 2022-06-22: qty 1
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
## 2022-06-22 NOTE — ED Triage Notes (Signed)
C/O cracking right lower tooth 3 days ago while eating. C/O significant pain with radiation into right ear. Has been taking IBU 800mg .

## 2022-06-22 NOTE — Discharge Instructions (Addendum)
You were seen today for cracked tooth.  I have given you a shot of toradol for pain while here.  I have sent out a few pain relievers to your pharmacy, and an antibiotic to take twice/day with food to avoid upset stomach if possible.  Please make an appointment with a dentist.  I have given you some information on that today.

## 2023-12-05 ENCOUNTER — Emergency Department (HOSPITAL_COMMUNITY)
Admission: EM | Admit: 2023-12-05 | Discharge: 2023-12-05 | Discharge status: Against Medical Advice | Payer: Self-pay | Attending: Emergency Medicine | Admitting: Emergency Medicine

## 2023-12-05 ENCOUNTER — Encounter (HOSPITAL_COMMUNITY): Payer: Self-pay | Admitting: *Deleted

## 2023-12-05 ENCOUNTER — Other Ambulatory Visit: Payer: Self-pay

## 2023-12-05 ENCOUNTER — Emergency Department (HOSPITAL_COMMUNITY): Payer: Self-pay

## 2023-12-05 DIAGNOSIS — Z5329 Procedure and treatment not carried out because of patient's decision for other reasons: Secondary | ICD-10-CM | POA: Diagnosis not present

## 2023-12-05 DIAGNOSIS — R55 Syncope and collapse: Secondary | ICD-10-CM | POA: Diagnosis present

## 2023-12-05 LAB — COMPREHENSIVE METABOLIC PANEL WITH GFR
ALT: 16 U/L (ref 0–44)
AST: 22 U/L (ref 15–41)
Albumin: 3.6 g/dL (ref 3.5–5.0)
Alkaline Phosphatase: 56 U/L (ref 38–126)
Anion gap: 10 (ref 5–15)
BUN: 11 mg/dL (ref 6–20)
CO2: 25 mmol/L (ref 22–32)
Calcium: 9.5 mg/dL (ref 8.9–10.3)
Chloride: 103 mmol/L (ref 98–111)
Creatinine, Ser: 1.23 mg/dL (ref 0.61–1.24)
GFR, Estimated: >60 mL/min (ref >60)
Glucose, Bld: 89 mg/dL (ref 70–99)
Potassium: 4.2 mmol/L (ref 3.5–5.1)
Sodium: 138 mmol/L (ref 135–145)
Total Bilirubin: 0.7 mg/dL (ref 0.0–1.2)
Total Protein: 6.6 g/dL (ref 6.5–8.1)

## 2023-12-05 LAB — URINALYSIS, ROUTINE W REFLEX MICROSCOPIC
Bilirubin Urine: NEGATIVE
Glucose, UA: NEGATIVE mg/dL
Hgb urine dipstick: NEGATIVE
Ketones, ur: NEGATIVE mg/dL
Leukocytes,Ua: NEGATIVE
Nitrite: NEGATIVE
Protein, ur: NEGATIVE mg/dL
Specific Gravity, Urine: 1.011 (ref 1.005–1.030)
pH: 6 (ref 5.0–8.0)

## 2023-12-05 LAB — CBC
HCT: 42.2 % (ref 39.0–52.0)
Hemoglobin: 14.4 g/dL (ref 13.0–17.0)
MCH: 29.6 pg (ref 26.0–34.0)
MCHC: 34.1 g/dL (ref 30.0–36.0)
MCV: 86.8 fL (ref 80.0–100.0)
Platelets: 266 K/uL (ref 150–400)
RBC: 4.86 MIL/uL (ref 4.22–5.81)
RDW: 13.6 % (ref 11.5–15.5)
WBC: 11 K/uL — ABNORMAL HIGH (ref 4.0–10.5)
nRBC: 0 % (ref 0.0–0.2)

## 2023-12-05 LAB — LIPASE, BLOOD: Lipase: 29 U/L (ref 11–51)

## 2023-12-05 MED ORDER — SODIUM CHLORIDE 0.9 % IV BOLUS: 1000.0000 mL | Freq: Once | INTRAVENOUS | Status: DC
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
## 2023-12-05 NOTE — ED Notes (Signed)
 Pt asked NT for a doctors note stating he needs to leave. NT told Pt's NT and asked to inform the nurse.

## 2023-12-05 NOTE — ED Notes (Signed)
 RN went to check on pt and he was gone.   RN check restrooms and hallway without success.

## 2023-12-05 NOTE — ED Provider Notes (Signed)
 [place] [facility] DEPARTMENT AT [facility] Provider Note   CSN: [id number] Arrival date & time: 12/05/23  1611     Patient presents with: Near Syncope   Bradley Huff is a 33 y.o. male who presents today after a syncopal event.  He states that he was getting up from his car when he suddenly began to feel dizzy, quickly followed by collapsing to the ground.  Immediately awoke, states he may have been on the ground for no more than a few minutes, was able to drive himself to the ED.  He states similar episode happened several days ago, did not seek evaluation at that time.  States this has been a recurrent thing, approximately 6 episodes of recent memory.  He denies taking any regular medications, states that he feels generally weak and that he believes he may be dehydrated.    Near Syncope       Prior to Admission medications   Medication Sig Start Date End Date Taking? Authorizing Provider  amoxicillin -clavulanate (AUGMENTIN ) 875-125 MG tablet Take 1 tablet by mouth every 12 (twelve) hours. 06/22/22   Piontek, Rocky, MD  benzonatate  (TESSALON ) 100 MG capsule Take 2 capsules (200 mg total) by mouth 2 (two) times daily as needed for cough. 07/16/16   Vicky Charleston, PA-C  ibuprofen  (ADVIL ) 800 MG tablet Take 800 mg by mouth every 8 (eight) hours as needed.    [provider]  ibuprofen  (ADVIL ,MOTRIN ) 600 MG tablet Take 1 tablet (600 mg total) by mouth every 6 (six) hours as needed. 07/16/16   Vicky Charleston, PA-C  ondansetron  (ZOFRAN ) 4 MG tablet Take 1 tablet (4 mg total) by mouth every 8 (eight) hours as needed for nausea or vomiting. 07/16/16   Vicky Charleston, PA-C  oseltamivir  (TAMIFLU ) 75 MG capsule Take 1 capsule (75 mg total) by mouth every 12 (twelve) hours. 07/16/16   Vicky Charleston, PA-C    Allergies: Patient has no known allergies.    Review of Systems  Cardiovascular:  Positive for near-syncope.  Neurological:  Positive for dizziness and syncope.   All other systems reviewed and are negative.   Updated Vital Signs BP 113/73 (BP Location: Right Arm)   Pulse 65   Temp 98 F (36.7 C) (Oral)   Resp 18   Ht 5' 4 (1.626 m)   Wt 54.4 kg   SpO2 100%   BMI 20.59 kg/m   Physical Exam Vitals and nursing note reviewed.  Constitutional:      General: He is not in acute distress.    Appearance: Normal appearance.  HENT:     Head: Normocephalic and atraumatic.     Mouth/Throat:     Mouth: Mucous membranes are moist.     Pharynx: Oropharynx is clear.  Eyes:     General: No visual field deficit.    Extraocular Movements: Extraocular movements intact.     Conjunctiva/sclera: Conjunctivae normal.     Pupils: Pupils are equal, round, and reactive to light.  Cardiovascular:     Rate and Rhythm: Normal rate and regular rhythm.     Pulses: Normal pulses.     Heart sounds: Normal heart sounds, S1 normal and S2 normal. No murmur heard.    No systolic murmur is present.     No diastolic murmur is present.     No friction rub. No gallop.  Pulmonary:     Effort: Pulmonary effort is normal.     Breath sounds: Normal breath sounds and air entry.  Abdominal:     General: Abdomen is flat. Bowel sounds are normal.     Palpations: Abdomen is soft.  Musculoskeletal:        General: Normal range of motion.     Cervical back: Normal range of motion and neck supple.     Right lower leg: No edema.     Left lower leg: No edema.     Comments: 5/5 strength to all 4 extremities.  Skin:    General: Skin is warm and dry.     Capillary Refill: Capillary refill takes less than 2 seconds.     Comments: Good skin turgor is noted  Neurological:     General: No focal deficit present.     Mental Status: He is alert and oriented to person, place, and time. Mental status is at baseline.     GCS: GCS eye subscore is 4. GCS verbal subscore is 5. GCS motor subscore is 6.     Cranial Nerves: No cranial nerve deficit, dysarthria or facial asymmetry.      Sensory: Sensation is intact.     Motor: Motor function is intact.     Coordination: Coordination is intact.  Psychiatric:        Mood and Affect: Mood normal.     (all labs ordered are listed, but only abnormal results are displayed) Labs Reviewed  CBC - Abnormal; Notable for the following components:      Result Value   WBC 11.0 (*)    All other components within normal limits  LIPASE, BLOOD  COMPREHENSIVE METABOLIC PANEL WITH GFR  URINALYSIS, ROUTINE W REFLEX MICROSCOPIC  CBG MONITORING, ED    EKG: None  Radiology: CT HEAD WO CONTRAST Result Date: 12/05/2023 CLINICAL DATA:  Syncope.  Concern for cerebrovascular accident. EXAM: CT HEAD WITHOUT CONTRAST TECHNIQUE: Contiguous axial images were obtained from the base of the skull through the vertex without intravenous contrast. RADIATION DOSE REDUCTION: This exam was performed according to the departmental dose-optimization program which includes automated exposure control, adjustment of the mA and/or kV according to patient size and/or use of iterative reconstruction technique. COMPARISON:  PET-CT 02/29/2008 FINDINGS: Nonstandard positioning. Brain: No acute intracranial hemorrhage. No focal mass lesion. No CT evidence of acute infarction. No midline shift or mass effect. No hydrocephalus. Basilar cisterns are patent. Vascular: No hyperdense vessel or unexpected calcification. Skull: Normal. Negative for fracture or focal lesion. Sinuses/Orbits: Paranasal sinuses and mastoid air cells are clear. Orbits are clear. Other: None. IMPRESSION: 1. No acute intracranial findings. 2. Normal head CT. Electronically Signed   By: Jackquline Boxer M.D.   On: 12/05/2023 17:41     Procedures   Medications Ordered in the ED  sodium chloride  0.9 % bolus 1,000 mL (has no administration in time range)                                    Medical Decision Making Amount and/or Complexity of Data Reviewed Radiology: ordered.   Medical Decision  Making:   Bradley Huff is a 33 y.o. male who presented to the ED today with syncopal event detailed above.     Complete initial physical exam performed, notably the patient  was alert and oriented in no apparent distress.  Physical exam as noted, no pertinent/remarkable findings of note.    Reviewed and confirmed nursing documentation for past medical history, family history, social history.    Initial  Assessment:   With the patient's presentation of syncope, differential diagnosis includes vasovagal syncope, orthostatic hypotension, cardiac arrhythmia, aortic stenosis, acute neurovascular insult.   Initial Plan:  To evaluate for syncope, order CT of the head without contrast to assess for intracranial bleed or other intracranial pathology. Screening labs including CBC and Metabolic panel to evaluate for infectious or metabolic etiology of disease.  Urinalysis with reflex culture ordered to evaluate for UTI or relevant urologic/nephrologic pathology.  Objective evaluation as below reviewed   Initial Study Results:   Laboratory  All laboratory results reviewed without evidence of clinically relevant pathology.   Exceptions include: Leukocytosis of 11   Radiology:  All images reviewed independently. Agree with radiology report at this time.   CT HEAD WO CONTRAST Result Date: 12/05/2023 CLINICAL DATA:  Syncope.  Concern for cerebrovascular accident. EXAM: CT HEAD WITHOUT CONTRAST TECHNIQUE: Contiguous axial images were obtained from the base of the skull through the vertex without intravenous contrast. RADIATION DOSE REDUCTION: This exam was performed according to the departmental dose-optimization program which includes automated exposure control, adjustment of the mA and/or kV according to patient size and/or use of iterative reconstruction technique. COMPARISON:  PET-CT 02/29/2008 FINDINGS: Nonstandard positioning. Brain: No acute intracranial hemorrhage. No focal mass lesion. No CT  evidence of acute infarction. No midline shift or mass effect. No hydrocephalus. Basilar cisterns are patent. Vascular: No hyperdense vessel or unexpected calcification. Skull: Normal. Negative for fracture or focal lesion. Sinuses/Orbits: Paranasal sinuses and mastoid air cells are clear. Orbits are clear. Other: None. IMPRESSION: 1. No acute intracranial findings. 2. Normal head CT. Electronically Signed   By: Jackquline Boxer M.D.   On: 12/05/2023 17:41     Reassessment and Plan:   After assessment of this patient, was unable to complete reassessment as patient had eloped prior to the ability to complete reassessment.  CT scan was unremarkable, there was a lab finding of a leukocytosis of 11, was not able to ascertain potential source of infection.  Unable to complete remainder of exam/reassessment as patient had eloped.  Orders been placed for 1 L fluid bolus however this was unable to be done as he had eloped prior to nursing being able to accomplish this.       Final diagnoses:  Syncope, unspecified syncope type    ED Discharge Orders     None          Myriam Dorn BROCKS, [place] 12/05/23 1845    Armenta Canning, MD 12/12/23 0005
